# Patient Record
Sex: Male | Born: 1975 | Race: Black or African American | Hispanic: No | Marital: Single | Smoking: Never smoker
Health system: Southern US, Community
[De-identification: ages and names within clinical notes are randomized; demographics above are authoritative.]

## PROBLEM LIST (undated history)

## (undated) DIAGNOSIS — B20 Human immunodeficiency virus [HIV] disease: Secondary | ICD-10-CM

## (undated) DIAGNOSIS — D849 Immunodeficiency, unspecified: Secondary | ICD-10-CM

---

## 2019-05-30 ENCOUNTER — Emergency Department (HOSPITAL_COMMUNITY)
Admission: EM | Admit: 2019-05-30 | Discharge: 2019-05-31 | Disposition: A | Discharge status: Home / Self Care | Payer: Self-pay | Attending: Emergency Medicine | Admitting: Emergency Medicine

## 2019-05-30 ENCOUNTER — Emergency Department (HOSPITAL_COMMUNITY): Payer: Self-pay

## 2019-05-30 ENCOUNTER — Encounter (HOSPITAL_COMMUNITY): Payer: Self-pay

## 2019-05-30 ENCOUNTER — Other Ambulatory Visit: Payer: Self-pay

## 2019-05-30 DIAGNOSIS — R7989 Other specified abnormal findings of blood chemistry: Secondary | ICD-10-CM | POA: Insufficient documentation

## 2019-05-30 DIAGNOSIS — R251 Tremor, unspecified: Secondary | ICD-10-CM | POA: Insufficient documentation

## 2019-05-30 DIAGNOSIS — R0789 Other chest pain: Secondary | ICD-10-CM | POA: Insufficient documentation

## 2019-05-30 DIAGNOSIS — F1510 Other stimulant abuse, uncomplicated: Secondary | ICD-10-CM | POA: Insufficient documentation

## 2019-05-30 DIAGNOSIS — R072 Precordial pain: Secondary | ICD-10-CM | POA: Insufficient documentation

## 2019-05-30 DIAGNOSIS — B20 Human immunodeficiency virus [HIV] disease: Secondary | ICD-10-CM | POA: Insufficient documentation

## 2019-05-30 DIAGNOSIS — F1910 Other psychoactive substance abuse, uncomplicated: Secondary | ICD-10-CM | POA: Insufficient documentation

## 2019-05-30 DIAGNOSIS — R112 Nausea with vomiting, unspecified: Secondary | ICD-10-CM | POA: Insufficient documentation

## 2019-05-30 DIAGNOSIS — Z79899 Other long term (current) drug therapy: Secondary | ICD-10-CM | POA: Insufficient documentation

## 2019-05-30 HISTORY — DX: Human immunodeficiency virus (HIV) disease: B20

## 2019-05-30 HISTORY — DX: Immunodeficiency, unspecified: D84.9

## 2019-05-30 LAB — CBC
HCT: 47.8 % (ref 39.0–52.0)
Hemoglobin: 16.2 g/dL (ref 13.0–17.0)
MCH: 30.6 pg (ref 26.0–34.0)
MCHC: 33.9 g/dL (ref 30.0–36.0)
MCV: 90.2 fL (ref 80.0–100.0)
Platelets: 283 10*3/uL (ref 150–400)
RBC: 5.3 MIL/uL (ref 4.22–5.81)
RDW: 12 % (ref 11.5–15.5)
WBC: 10.9 10*3/uL — ABNORMAL HIGH (ref 4.0–10.5)
nRBC: 0 % (ref 0.0–0.2)

## 2019-05-30 LAB — URINALYSIS, ROUTINE W REFLEX MICROSCOPIC
Bilirubin Urine: NEGATIVE
Glucose, UA: NEGATIVE mg/dL
Hgb urine dipstick: NEGATIVE
Ketones, ur: NEGATIVE mg/dL
Leukocytes,Ua: NEGATIVE
Nitrite: NEGATIVE
Protein, ur: NEGATIVE mg/dL
Specific Gravity, Urine: 1.002 — ABNORMAL LOW (ref 1.005–1.030)
pH: 6 (ref 5.0–8.0)

## 2019-05-30 LAB — BASIC METABOLIC PANEL
Anion gap: 11 (ref 5–15)
BUN: 15 mg/dL (ref 6–20)
CO2: 24 mmol/L (ref 22–32)
Calcium: 10 mg/dL (ref 8.9–10.3)
Chloride: 95 mmol/L — ABNORMAL LOW (ref 98–111)
Creatinine, Ser: 1.32 mg/dL — ABNORMAL HIGH (ref 0.61–1.24)
GFR calc Af Amer: >60 mL/min (ref >60)
GFR calc non Af Amer: >60 mL/min (ref >60)
Glucose, Bld: 114 mg/dL — ABNORMAL HIGH (ref 70–99)
Potassium: 4 mmol/L (ref 3.5–5.1)
Sodium: 130 mmol/L — ABNORMAL LOW (ref 135–145)

## 2019-05-30 LAB — RAPID URINE DRUG SCREEN, HOSP PERFORMED
Amphetamines: POSITIVE — AB
Barbiturates: NOT DETECTED
Benzodiazepines: NOT DETECTED
Cocaine: NOT DETECTED
Opiates: NOT DETECTED
Tetrahydrocannabinol: NOT DETECTED

## 2019-05-30 LAB — TROPONIN I (HIGH SENSITIVITY)
Troponin I (High Sensitivity): 6 ng/L (ref <18)
Troponin I (High Sensitivity): 7 ng/L (ref <18)

## 2019-05-30 LAB — CK: Total CK: 324 U/L (ref 49–397)

## 2019-05-30 MED ORDER — LORAZEPAM 2 MG/ML IJ SOLN
2.0000 mg | Freq: Once | INTRAMUSCULAR | Status: AC
  Administered 2019-05-30: 2 mg via INTRAVENOUS
  Filled 2019-05-30: qty 1

## 2019-05-30 MED ORDER — LORAZEPAM 2 MG/ML IJ SOLN
1.0000 mg | Freq: Once | INTRAMUSCULAR | Status: AC
  Administered 2019-05-30: 22:00:00 1 mg via INTRAVENOUS
  Filled 2019-05-30: qty 1

## 2019-05-30 MED ORDER — SODIUM CHLORIDE 0.9 % IV BOLUS
1000.0000 mL | Freq: Once | INTRAVENOUS | Status: AC
  Administered 2019-05-30: 23:00:00 1000 mL via INTRAVENOUS

## 2019-05-30 MED ORDER — LORAZEPAM 2 MG/ML IJ SOLN
1.0000 mg | Freq: Once | INTRAMUSCULAR | Status: AC
  Administered 2019-05-30: 1 mg via INTRAVENOUS
  Filled 2019-05-30: qty 1

## 2019-05-30 MED ORDER — SODIUM CHLORIDE 0.9 % IV BOLUS
1000.0000 mL | Freq: Once | INTRAVENOUS | Status: AC
  Administered 2019-05-30: 20:00:00 1000 mL via INTRAVENOUS

## 2019-05-30 MED ORDER — IOHEXOL 350 MG/ML SOLN
100.0000 mL | Freq: Once | INTRAVENOUS | Status: AC | PRN
  Administered 2019-05-30: 21:00:00 100 mL via INTRAVENOUS

## 2019-05-30 MED ORDER — ONDANSETRON HCL 4 MG/2ML IJ SOLN
4.0000 mg | Freq: Once | INTRAMUSCULAR | Status: AC
  Administered 2019-05-30: 4 mg via INTRAVENOUS
  Filled 2019-05-30: qty 2

## 2019-05-30 MED ORDER — HALOPERIDOL LACTATE 5 MG/ML IJ SOLN
5.0000 mg | Freq: Once | INTRAMUSCULAR | Status: DC
  Filled 2019-05-30: qty 1

## 2019-05-30 NOTE — ED Notes (Signed)
Went to get pt from waiting room. Pt was in the bathroom having an episode of emesis. Sort tech will bring back when he gets out.

## 2019-05-30 NOTE — ED Notes (Signed)
Patient transported to CT 

## 2019-05-30 NOTE — ED Triage Notes (Signed)
Pt endorses generalized chest pain with meth use x 3 days. Tachy. Axox4. HIV positive and has not without hiv meds x 3 days.
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
## 2019-05-31 ENCOUNTER — Emergency Department (HOSPITAL_COMMUNITY): Payer: Self-pay

## 2019-05-31 ENCOUNTER — Ambulatory Visit (HOSPITAL_COMMUNITY)
Admission: EM | Admit: 2019-05-31 | Discharge: 2019-05-31 | Disposition: A | Discharge status: Home / Self Care | Payer: No Typology Code available for payment source | Source: Ambulatory Visit | Attending: Emergency Medicine | Admitting: Emergency Medicine

## 2019-05-31 DIAGNOSIS — Z0441 Encounter for examination and observation following alleged adult rape: Secondary | ICD-10-CM | POA: Insufficient documentation

## 2019-05-31 MED ORDER — HALOPERIDOL LACTATE 5 MG/ML IJ SOLN
5.0000 mg | Freq: Once | INTRAMUSCULAR | Status: AC
  Administered 2019-05-31: 5 mg via INTRAVENOUS

## 2019-05-31 NOTE — ED Notes (Signed)
SANE RN at bedside.

## 2019-05-31 NOTE — ED Notes (Signed)
SANE RN returned to bedside, pt still not staying awake for exam. SANE RN will return in another hour.
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
## 2019-05-31 NOTE — ED Notes (Signed)
Pt given SANE follow up, no further questions, VSS, NAD. Pt ambulated to the waiting room independently without difficulty. Pt to call for ride from waiting room. Pt ate a sandwich bag and drink a sprite prior to leaving. Tolerated. Encouraged to stop doing meth.
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
## 2019-05-31 NOTE — ED Notes (Signed)
SANE RN at bedside, she will come back in 1 hour due to pt not staying awake during exam.
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
## 2019-05-31 NOTE — ED Notes (Signed)
SANE exam completed.

## 2019-05-31 NOTE — SANE Note (Signed)
FNE received phone call from provider at approximately [phone].  Provider stated that patient had been heavily medicated overnight but was now able to answer questions.  Patient is requesting to speak with an FNE.  FNE advised provider that Oscar Williams., FNE would be coming to speak with the patient.

## 2020-06-28 IMAGING — CR DG CHEST 2V
2 series · 2 of 2 positions shown · non-contrast
Comparison: None.

CLINICAL DATA: Chest pain and shortness of breath

EXAM:
CHEST - 2 VIEW

[chest lat]
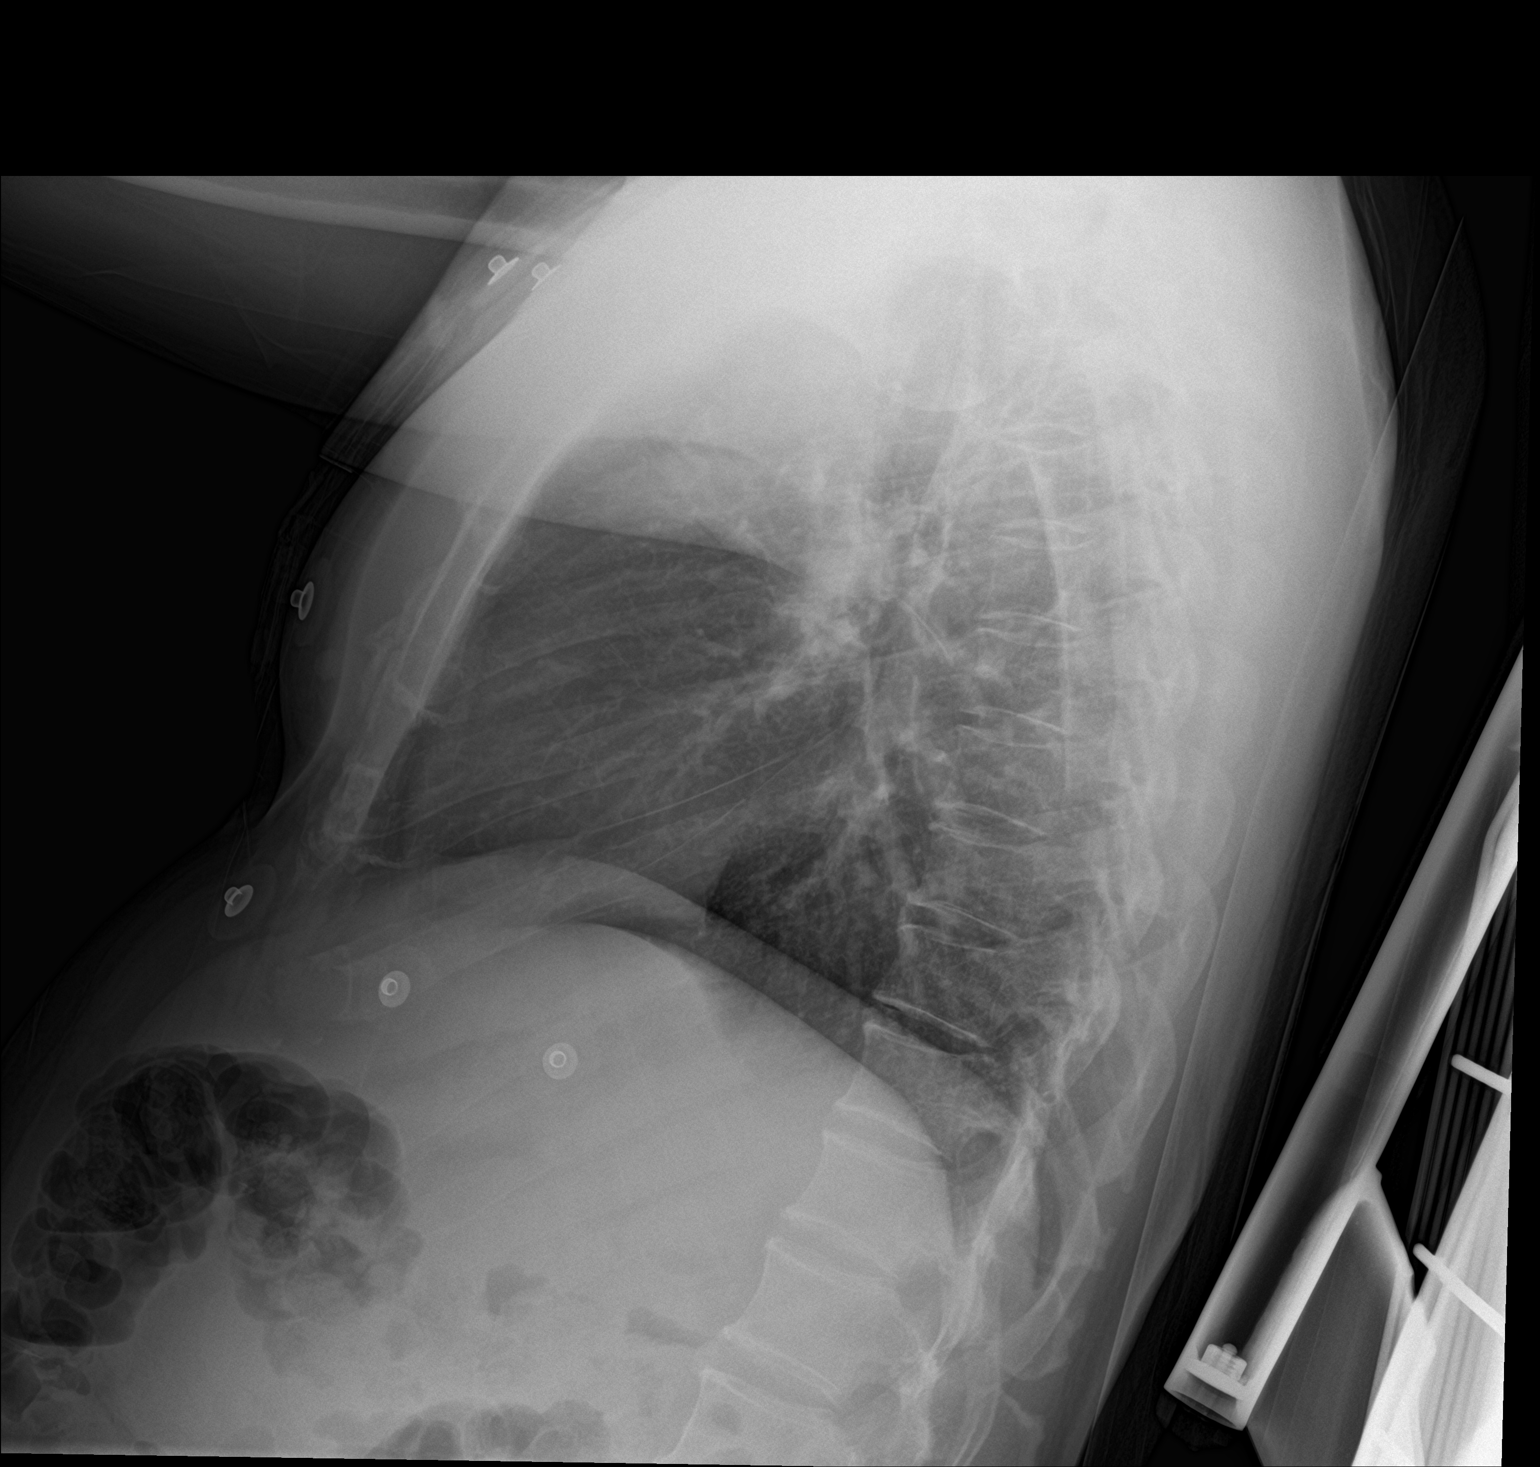

[chest ap]
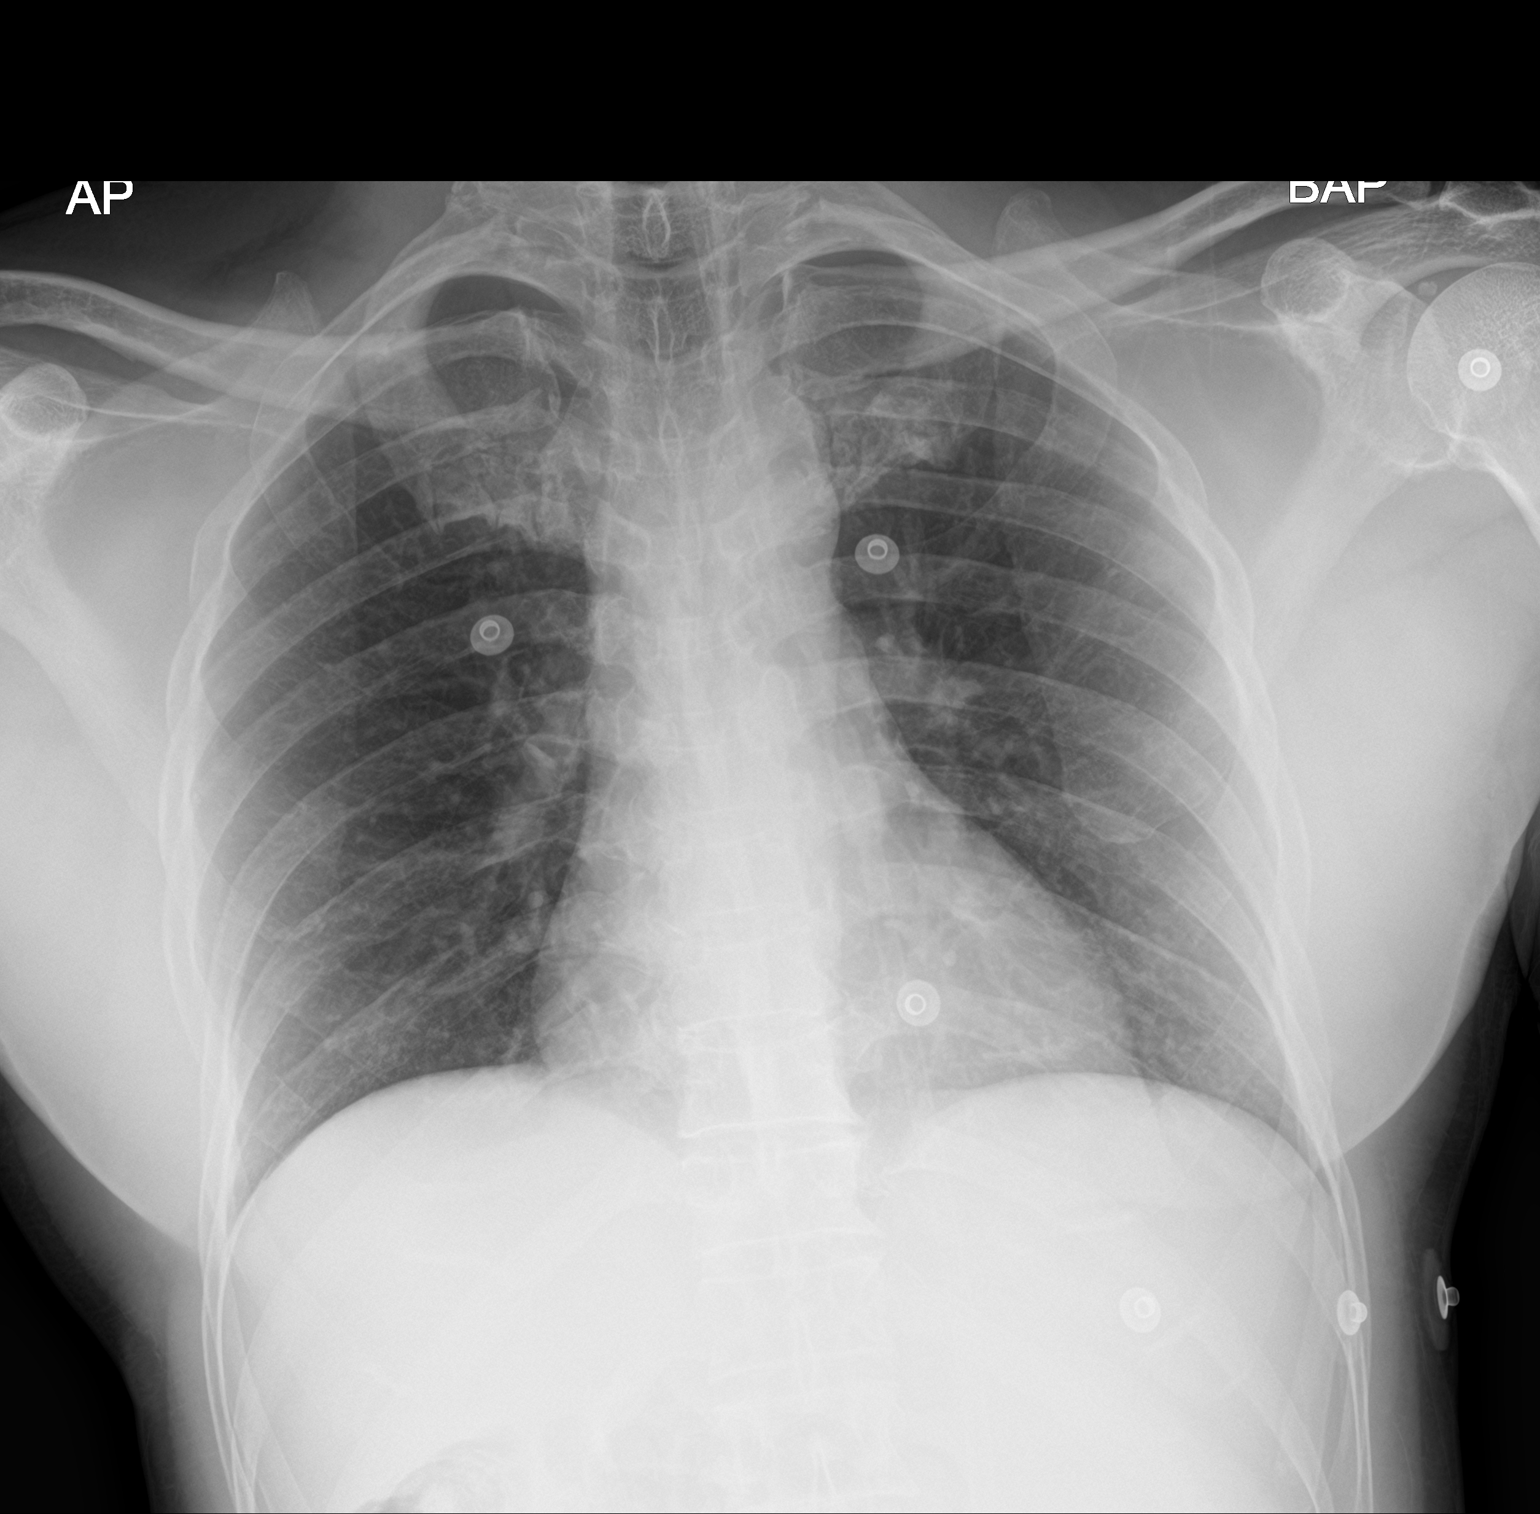

[2 of 2 positions shown; findings below may reference images not displayed]

FINDINGS: Lungs are clear. Heart size and pulmonary vascularity are normal. No
adenopathy. No pneumothorax. No bone lesions.
IMPRESSION: No edema or consolidation.

## 2020-06-28 IMAGING — CT CT ANGIO CHEST-ABD-PELV FOR DISSECTION W/ AND WO/W CM
2 of 7 series · 13 of 46 positions shown, 15 images · IV contrast (OMNI)
Comparison: None.

CLINICAL DATA: CHEST PAIN with meth use

EXAM:
CT ANGIOGRAPHY CHEST, ABDOMEN AND PELVIS
TECHNIQUE: Multidetector CT imaging through the chest, abdomen and pelvis was
performed using the standard protocol during bolus administration of
intravenous contrast. Multiplanar reconstructed images and MIPs were
obtained and reviewed to evaluate the vascular anatomy.
CONTRAST:  100mL OMNIPAQUE IOHEXOL 350 MG/ML SOLN

[Series 6: dissection 3.0 i30f 3 · axial · 0.78mm/px · z∈[+793,+1336]mm · 10 of 205 slices shown, 12 images]
[im 12/205  soft-tissue]
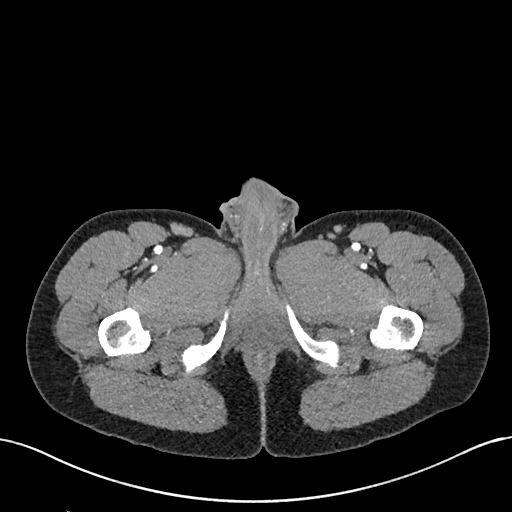
[im 12/205  bone]
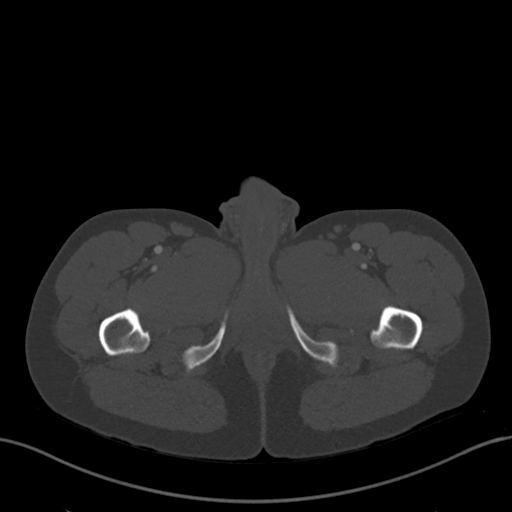
[im 35/205  soft-tissue]
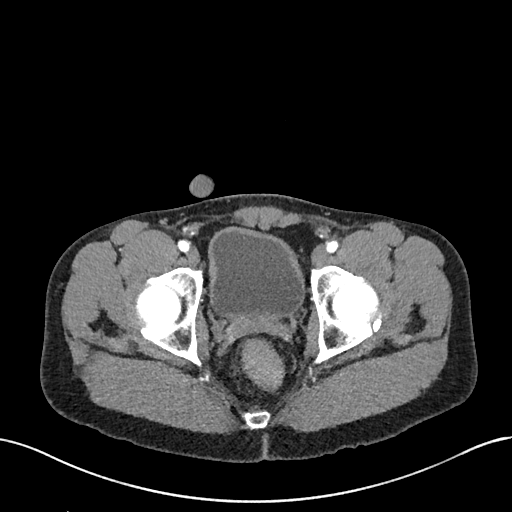
[im 57/205  soft-tissue]
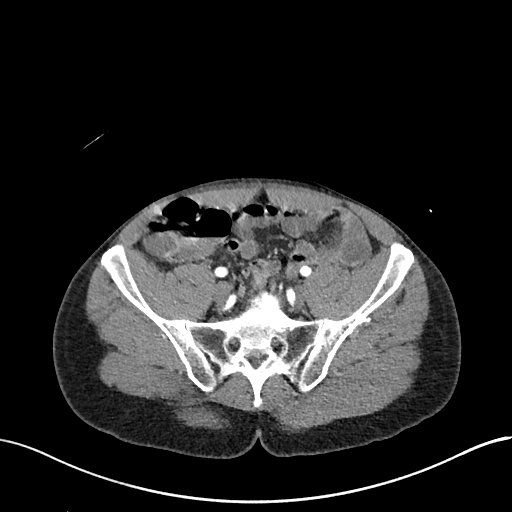
[im 69/205  soft-tissue]
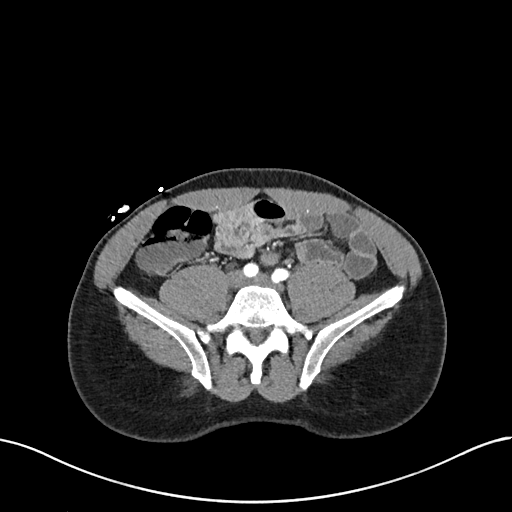
[im 91/205  soft-tissue]
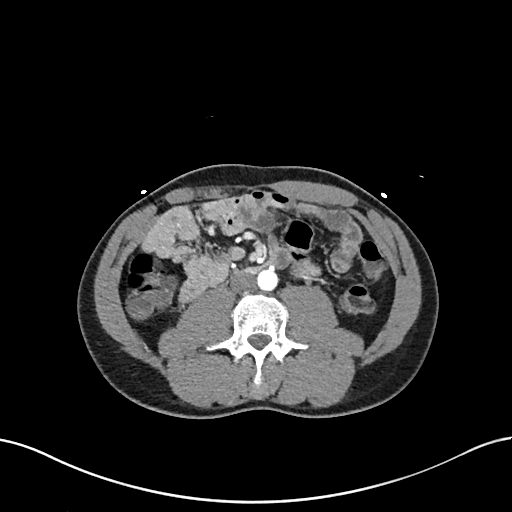
[im 114/205  soft-tissue]
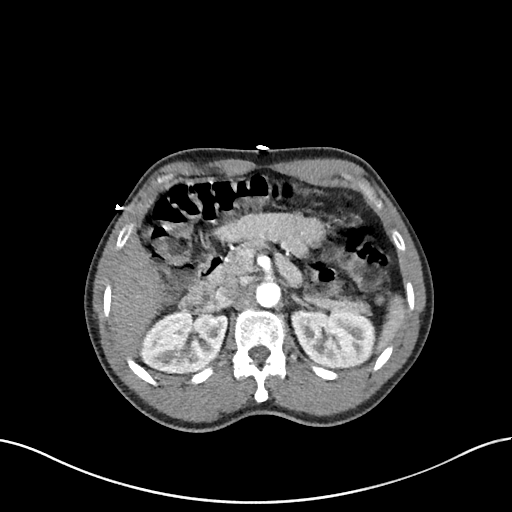
[im 137/205  soft-tissue]
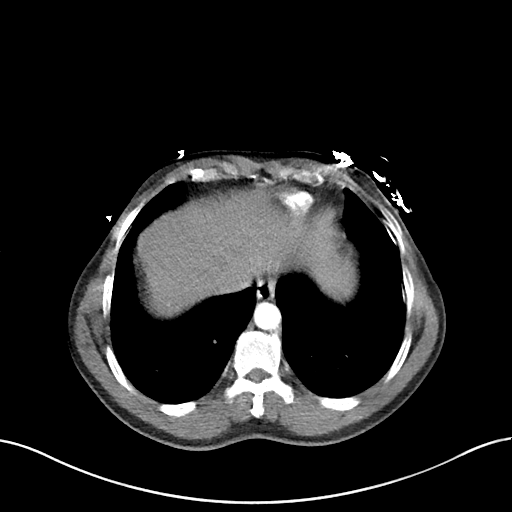
[im 148/205  soft-tissue]
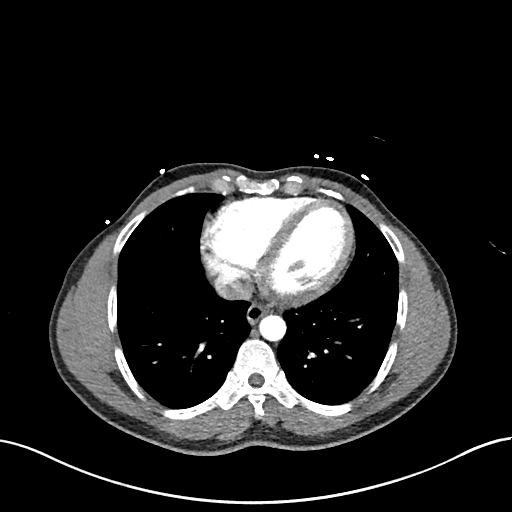
[im 171/205  soft-tissue]
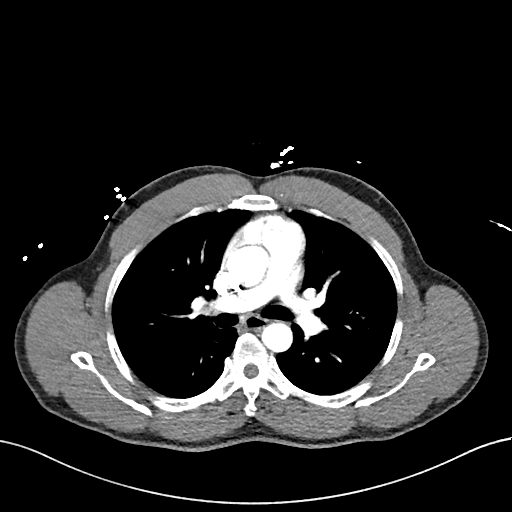
[im 171/205  bone]
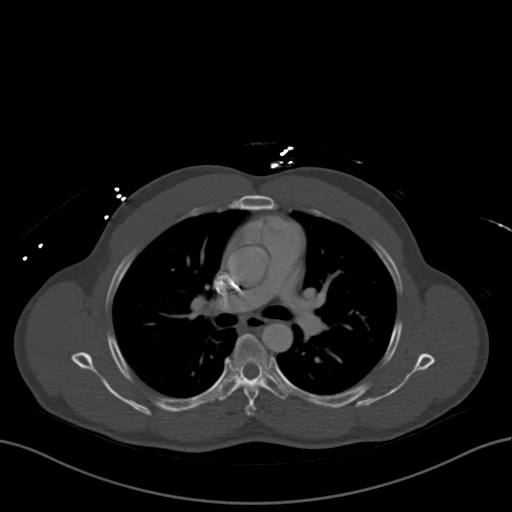
[im 193/205  soft-tissue]
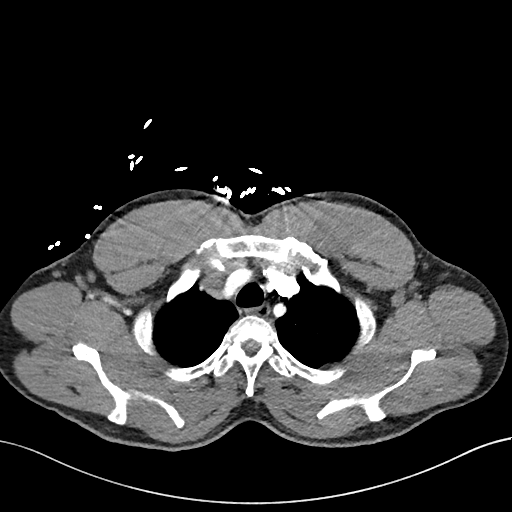

[Series 9: coronals · coronal · 0.65mm/px · 3 of 132 slices shown]
[im 33/132  soft-tissue]
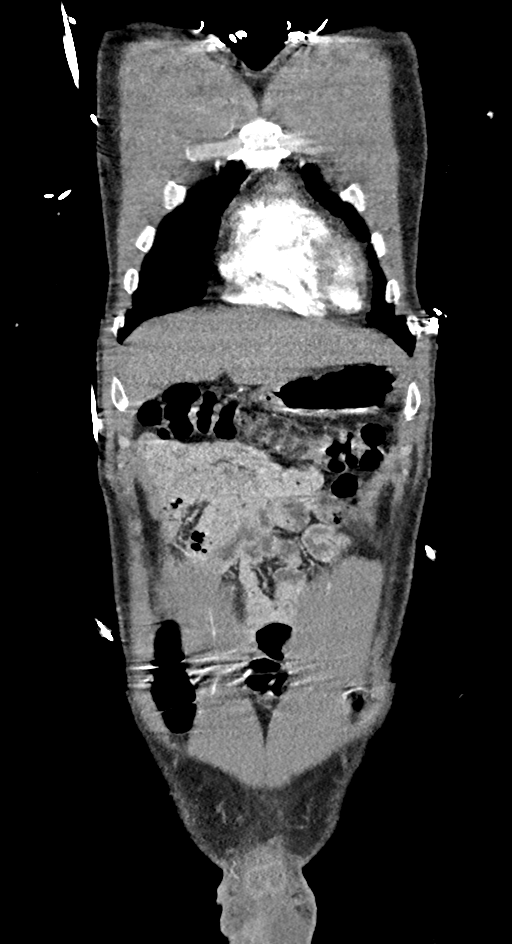
[im 66/132  soft-tissue]
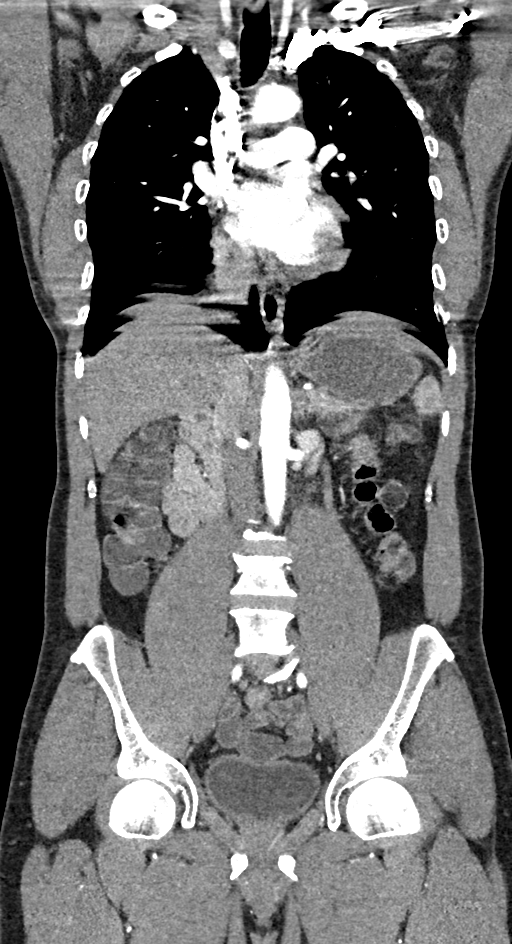
[im 99/132  soft-tissue]
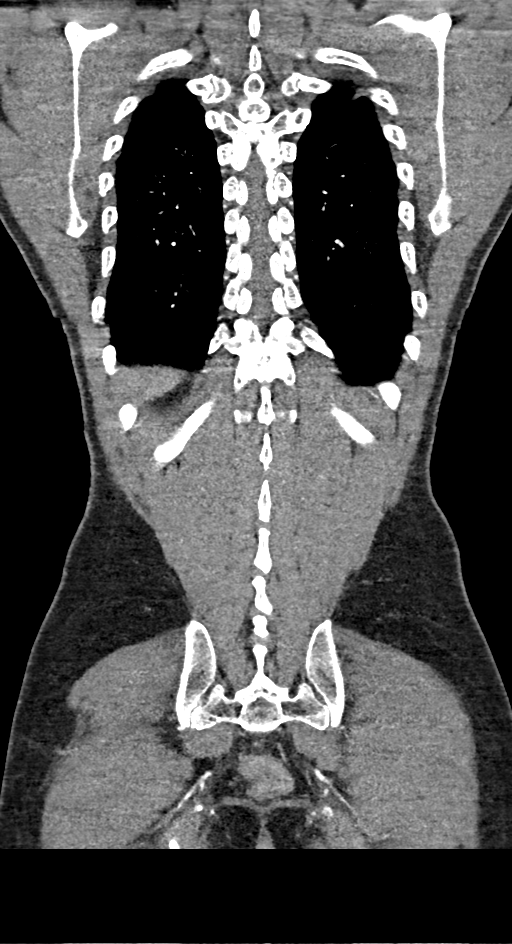

[13 of 46 positions shown; findings below may reference images not displayed]

FINDINGS: CTA CHEST FINDINGS

Cardiovascular: Heart size normal. No pericardial effusion.
Satisfactory opacification of pulmonary arteries noted, and there is
no evidence of pulmonary emboli. Adequate contrast opacification of
the thoracic aorta with no evidence of dissection, aneurysm, or
stenosis. There is classic 3-vessel brachiocephalic arch anatomy
without proximal stenosis. No significant atheromatous change.

Mediastinum/Nodes: No hilar or mediastinal adenopathy.

Lungs/Pleura: No pleural effusion. No pneumothorax. Lungs are clear.
Some images are degraded by continued motion during the acquisition.

Musculoskeletal: No chest wall abnormality. No acute or significant
osseous findings.

Review of the MIP images confirms the above findings.

CTA ABDOMEN AND PELVIS FINDINGS

VASCULAR

Aorta: Normal caliber aorta without aneurysm, dissection, vasculitis
or significant stenosis.

Celiac: Diminutive supplying only the common hepatic and left
gastric arteries.

SMA: Widely patent. Splenic artery arises from the SMA, an anatomic
variant. Classic distal branch anatomy.

Renals: Both renal arteries are patent without evidence of aneurysm,
dissection, vasculitis, fibromuscular dysplasia or significant
stenosis.

IMA: Patent without evidence of aneurysm, dissection, vasculitis or
significant stenosis.

Inflow: Patent without evidence of aneurysm, dissection, vasculitis
or significant stenosis.

Veins: No obvious venous abnormality within the limitations of this
arterial phase study.

Review of the MIP images confirms the above findings.

NON-VASCULAR

Hepatobiliary: No focal liver abnormality is seen. No gallstones,
gallbladder wall thickening, or biliary dilatation.

Pancreas: Unremarkable. No pancreatic ductal dilatation or
surrounding inflammatory changes.

Spleen: Normal in size without focal abnormality.

Adrenals/Urinary Tract: Adrenal glands are unremarkable. Kidneys are
normal, without renal calculi, focal lesion, or hydronephrosis.
Bladder is unremarkable.

Stomach/Bowel: Stomach is physiologically distended. Small bowel is
nondilated. Normal appendix. The colon is nondilated, unremarkable.

Lymphatic: No abdominal or pelvic adenopathy.

Reproductive: Prostate is unremarkable.

Other: No ascites. No free air.

Musculoskeletal: No acute or significant osseous findings.

Review of the MIP images confirms the above findings.
IMPRESSION: Negative.

No acute PE or  aortic dissection.

## 2020-06-29 IMAGING — DX DG ABDOMEN 1V
1 series · 1 of 1 positions shown · non-contrast
Comparison: CT 05/30/2019.

CLINICAL DATA: [HOSPITAL] meth insert in the rectum 3 days ago. Acute
intoxication.

EXAM:
ABDOMEN - 1 VIEW

[abdomen kub]
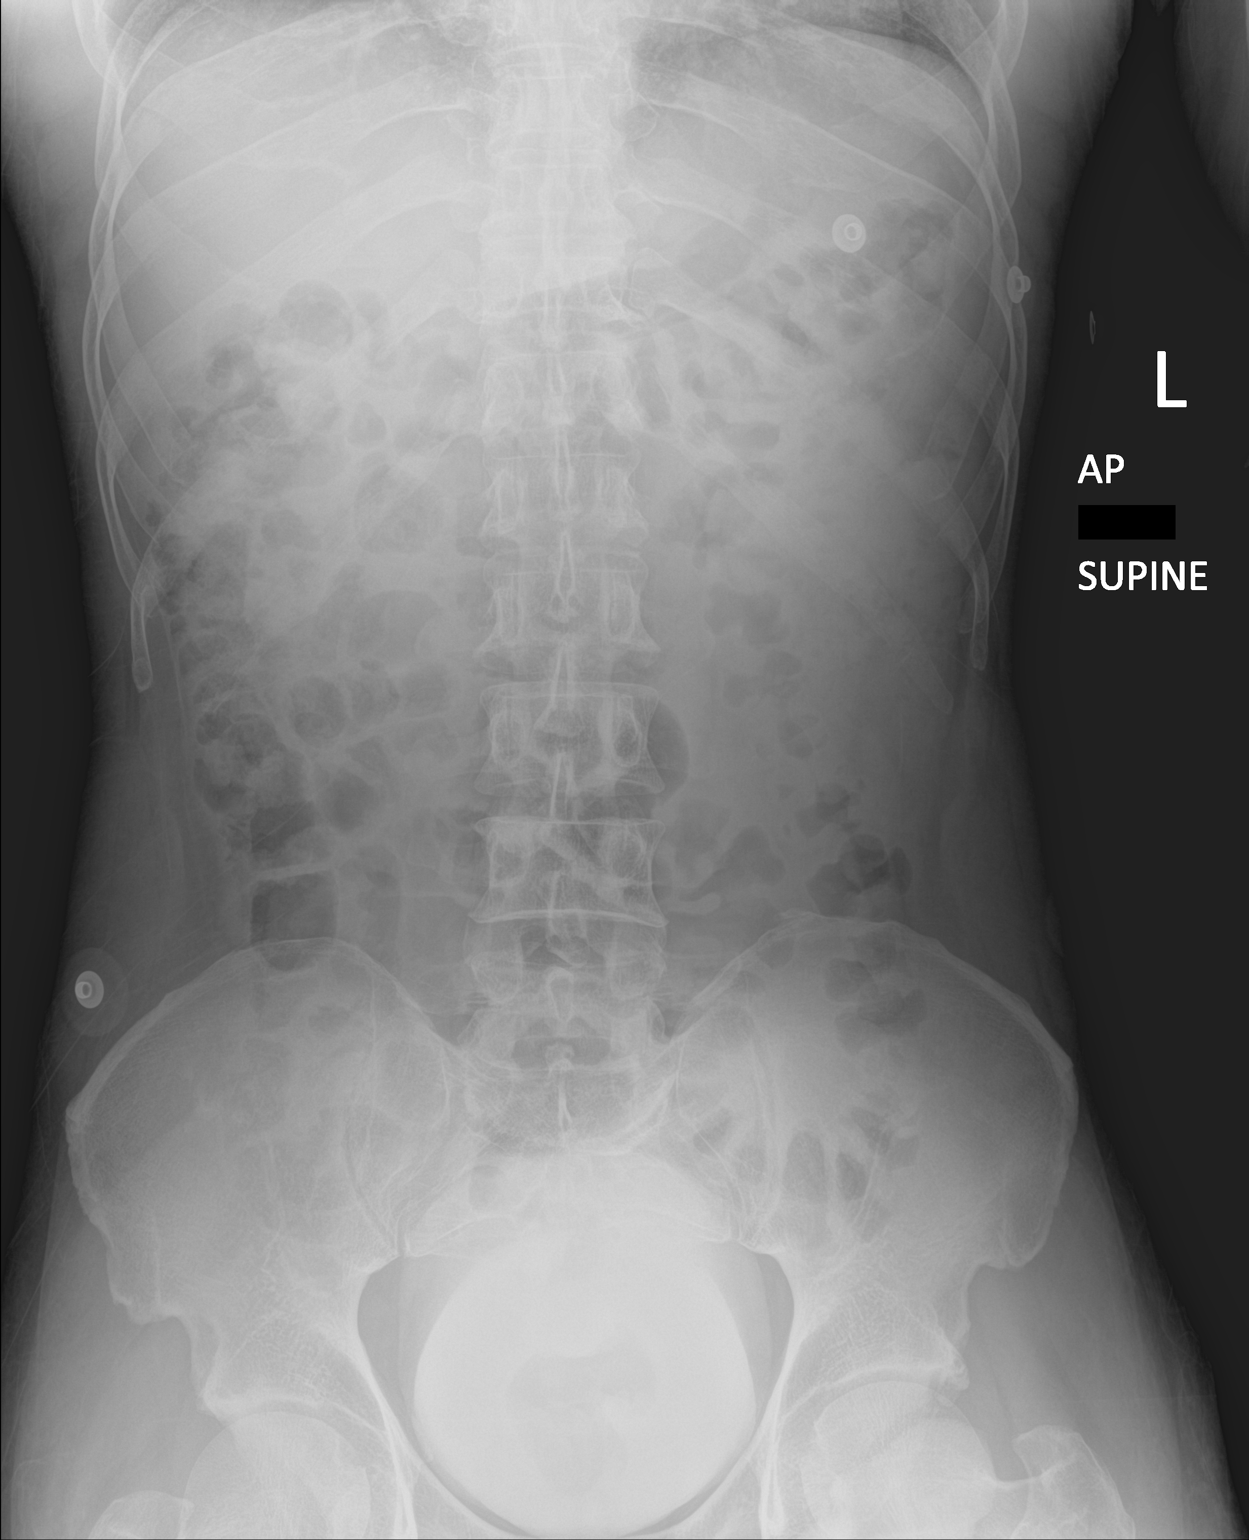

[1 of 1 positions shown; findings below may reference images not displayed]

FINDINGS: Soft tissue structures are unremarkable. No bowel distention. No
free air. No radiopaque foreign bodies. Contrast noted in the
bladder from prior CT.
IMPRESSION: No acute abnormality identified. No bowel distention. No radiopaque
foreign bodies.
